# Patient Record
Sex: Male | Born: 1935 | Race: White | Hispanic: No | Marital: Married | Smoking: Never smoker
Health system: Southern US, Community
[De-identification: ages and names within clinical notes are randomized; demographics above are authoritative.]

## PROBLEM LIST (undated history)

## (undated) DIAGNOSIS — E785 Hyperlipidemia, unspecified: Secondary | ICD-10-CM

## (undated) DIAGNOSIS — R197 Diarrhea, unspecified: Secondary | ICD-10-CM

## (undated) DIAGNOSIS — H547 Unspecified visual loss: Secondary | ICD-10-CM

## (undated) DIAGNOSIS — I639 Cerebral infarction, unspecified: Secondary | ICD-10-CM

## (undated) HISTORY — PX: BACK SURGERY: SHX140

## (undated) HISTORY — PX: JOINT REPLACEMENT: SHX530

## (undated) HISTORY — PX: APPENDECTOMY: SHX54

---

## 2021-01-07 ENCOUNTER — Inpatient Hospital Stay (HOSPITAL_COMMUNITY)
Admission: EM | Admit: 2021-01-07 | Discharge: 2021-01-14 | DRG: 565 | Disposition: A | Discharge status: Skilled Nursing Facility | Payer: No Typology Code available for payment source | Attending: General Surgery | Admitting: General Surgery

## 2021-01-07 ENCOUNTER — Emergency Department (HOSPITAL_COMMUNITY): Payer: No Typology Code available for payment source

## 2021-01-07 ENCOUNTER — Other Ambulatory Visit: Payer: Self-pay

## 2021-01-07 ENCOUNTER — Encounter (HOSPITAL_COMMUNITY): Payer: Self-pay | Admitting: General Surgery

## 2021-01-07 DIAGNOSIS — Z880 Allergy status to penicillin: Secondary | ICD-10-CM

## 2021-01-07 DIAGNOSIS — R0902 Hypoxemia: Secondary | ICD-10-CM | POA: Diagnosis not present

## 2021-01-07 DIAGNOSIS — I714 Abdominal aortic aneurysm, without rupture: Secondary | ICD-10-CM | POA: Diagnosis present

## 2021-01-07 DIAGNOSIS — I4819 Other persistent atrial fibrillation: Secondary | ICD-10-CM | POA: Diagnosis present

## 2021-01-07 DIAGNOSIS — I4891 Unspecified atrial fibrillation: Secondary | ICD-10-CM | POA: Diagnosis not present

## 2021-01-07 DIAGNOSIS — I712 Thoracic aortic aneurysm, without rupture: Secondary | ICD-10-CM | POA: Diagnosis present

## 2021-01-07 DIAGNOSIS — Z20822 Contact with and (suspected) exposure to covid-19: Secondary | ICD-10-CM | POA: Diagnosis present

## 2021-01-07 DIAGNOSIS — Z8673 Personal history of transient ischemic attack (TIA), and cerebral infarction without residual deficits: Secondary | ICD-10-CM

## 2021-01-07 DIAGNOSIS — S2232XA Fracture of one rib, left side, initial encounter for closed fracture: Secondary | ICD-10-CM | POA: Diagnosis present

## 2021-01-07 DIAGNOSIS — R339 Retention of urine, unspecified: Secondary | ICD-10-CM | POA: Diagnosis not present

## 2021-01-07 DIAGNOSIS — Z888 Allergy status to other drugs, medicaments and biological substances status: Secondary | ICD-10-CM | POA: Diagnosis not present

## 2021-01-07 DIAGNOSIS — S2222XA Fracture of body of sternum, initial encounter for closed fracture: Principal | ICD-10-CM

## 2021-01-07 DIAGNOSIS — I350 Nonrheumatic aortic (valve) stenosis: Secondary | ICD-10-CM | POA: Diagnosis present

## 2021-01-07 DIAGNOSIS — Z8249 Family history of ischemic heart disease and other diseases of the circulatory system: Secondary | ICD-10-CM | POA: Diagnosis not present

## 2021-01-07 DIAGNOSIS — Y92410 Unspecified street and highway as the place of occurrence of the external cause: Secondary | ICD-10-CM

## 2021-01-07 DIAGNOSIS — S2220XA Unspecified fracture of sternum, initial encounter for closed fracture: Secondary | ICD-10-CM | POA: Diagnosis present

## 2021-01-07 DIAGNOSIS — I959 Hypotension, unspecified: Secondary | ICD-10-CM | POA: Diagnosis not present

## 2021-01-07 DIAGNOSIS — Z23 Encounter for immunization: Secondary | ICD-10-CM | POA: Diagnosis not present

## 2021-01-07 DIAGNOSIS — R410 Disorientation, unspecified: Secondary | ICD-10-CM | POA: Diagnosis not present

## 2021-01-07 DIAGNOSIS — Z66 Do not resuscitate: Secondary | ICD-10-CM | POA: Diagnosis present

## 2021-01-07 DIAGNOSIS — E785 Hyperlipidemia, unspecified: Secondary | ICD-10-CM | POA: Diagnosis present

## 2021-01-07 DIAGNOSIS — Z79899 Other long term (current) drug therapy: Secondary | ICD-10-CM | POA: Diagnosis not present

## 2021-01-07 DIAGNOSIS — N401 Enlarged prostate with lower urinary tract symptoms: Secondary | ICD-10-CM | POA: Diagnosis present

## 2021-01-07 DIAGNOSIS — N138 Other obstructive and reflux uropathy: Secondary | ICD-10-CM | POA: Diagnosis present

## 2021-01-07 DIAGNOSIS — I639 Cerebral infarction, unspecified: Secondary | ICD-10-CM | POA: Insufficient documentation

## 2021-01-07 DIAGNOSIS — S2239XA Fracture of one rib, unspecified side, initial encounter for closed fracture: Secondary | ICD-10-CM | POA: Diagnosis present

## 2021-01-07 DIAGNOSIS — R079 Chest pain, unspecified: Secondary | ICD-10-CM | POA: Diagnosis present

## 2021-01-07 DIAGNOSIS — H3530 Unspecified macular degeneration: Secondary | ICD-10-CM | POA: Diagnosis present

## 2021-01-07 DIAGNOSIS — Z886 Allergy status to analgesic agent status: Secondary | ICD-10-CM | POA: Diagnosis not present

## 2021-01-07 DIAGNOSIS — E876 Hypokalemia: Secondary | ICD-10-CM | POA: Diagnosis present

## 2021-01-07 DIAGNOSIS — Z7901 Long term (current) use of anticoagulants: Secondary | ICD-10-CM

## 2021-01-07 DIAGNOSIS — H547 Unspecified visual loss: Secondary | ICD-10-CM | POA: Insufficient documentation

## 2021-01-07 DIAGNOSIS — Z87898 Personal history of other specified conditions: Secondary | ICD-10-CM

## 2021-01-07 HISTORY — DX: Cerebral infarction, unspecified: I63.9

## 2021-01-07 HISTORY — DX: Hyperlipidemia, unspecified: E78.5

## 2021-01-07 HISTORY — DX: Diarrhea, unspecified: R19.7

## 2021-01-07 HISTORY — DX: Unspecified visual loss: H54.7

## 2021-01-07 LAB — COMPREHENSIVE METABOLIC PANEL
ALT: 26 U/L (ref 0–44)
AST: 49 U/L — ABNORMAL HIGH (ref 15–41)
Albumin: 3.2 g/dL — ABNORMAL LOW (ref 3.5–5.0)
Alkaline Phosphatase: 75 U/L (ref 38–126)
Anion gap: 5 (ref 5–15)
BUN: 19 mg/dL (ref 8–23)
CO2: 28 mmol/L (ref 22–32)
Calcium: 8.5 mg/dL — ABNORMAL LOW (ref 8.9–10.3)
Chloride: 105 mmol/L (ref 98–111)
Creatinine, Ser: 0.78 mg/dL (ref 0.61–1.24)
GFR, Estimated: >60 mL/min (ref >60)
Glucose, Bld: 103 mg/dL — ABNORMAL HIGH (ref 70–99)
Potassium: 4.6 mmol/L (ref 3.5–5.1)
Sodium: 138 mmol/L (ref 135–145)
Total Bilirubin: 1.8 mg/dL — ABNORMAL HIGH (ref 0.3–1.2)
Total Protein: 5.8 g/dL — ABNORMAL LOW (ref 6.5–8.1)

## 2021-01-07 LAB — RESP PANEL BY RT-PCR (FLU A&B, COVID) ARPGX2
Influenza A by PCR: NEGATIVE
Influenza B by PCR: NEGATIVE
SARS Coronavirus 2 by RT PCR: NEGATIVE

## 2021-01-07 LAB — I-STAT CHEM 8, ED
BUN: 27 mg/dL — ABNORMAL HIGH (ref 8–23)
Calcium, Ion: 1.15 mmol/L (ref 1.15–1.40)
Chloride: 105 mmol/L (ref 98–111)
Creatinine, Ser: 0.7 mg/dL (ref 0.61–1.24)
Glucose, Bld: 99 mg/dL (ref 70–99)
HCT: 36 % — ABNORMAL LOW (ref 39.0–52.0)
Hemoglobin: 12.2 g/dL — ABNORMAL LOW (ref 13.0–17.0)
Potassium: 4 mmol/L (ref 3.5–5.1)
Sodium: 141 mmol/L (ref 135–145)
TCO2: 29 mmol/L (ref 22–32)

## 2021-01-07 LAB — SAMPLE TO BLOOD BANK

## 2021-01-07 LAB — CBC
HCT: 37.2 % — ABNORMAL LOW (ref 39.0–52.0)
Hemoglobin: 12.5 g/dL — ABNORMAL LOW (ref 13.0–17.0)
MCH: 33.1 pg (ref 26.0–34.0)
MCHC: 33.6 g/dL (ref 30.0–36.0)
MCV: 98.4 fL (ref 80.0–100.0)
Platelets: 180 10*3/uL (ref 150–400)
RBC: 3.78 MIL/uL — ABNORMAL LOW (ref 4.22–5.81)
RDW: 13.3 % (ref 11.5–15.5)
WBC: 11.3 10*3/uL — ABNORMAL HIGH (ref 4.0–10.5)
nRBC: 0 % (ref 0.0–0.2)

## 2021-01-07 LAB — LACTIC ACID, PLASMA: Lactic Acid, Venous: 1.7 mmol/L (ref 0.5–1.9)

## 2021-01-07 LAB — ETHANOL: Alcohol, Ethyl (B): <10 mg/dL (ref <10)

## 2021-01-07 LAB — TROPONIN I (HIGH SENSITIVITY)
Troponin I (High Sensitivity): 17 ng/L (ref <18)
Troponin I (High Sensitivity): 217 ng/L (ref <18)

## 2021-01-07 LAB — PROTIME-INR
INR: 1.2 (ref 0.8–1.2)
Prothrombin Time: 15.6 seconds — ABNORMAL HIGH (ref 11.4–15.2)

## 2021-01-07 MED ORDER — TETANUS-DIPHTH-ACELL PERTUSSIS 5-2.5-18.5 LF-MCG/0.5 IM SUSY
0.5000 mL | PREFILLED_SYRINGE | Freq: Once | INTRAMUSCULAR | Status: AC
  Administered 2021-01-07: 0.5 mL via INTRAMUSCULAR
  Filled 2021-01-07: qty 0.5

## 2021-01-07 MED ORDER — FENTANYL CITRATE PF 50 MCG/ML IJ SOSY
25.0000 ug | PREFILLED_SYRINGE | Freq: Once | INTRAMUSCULAR | Status: AC
  Administered 2021-01-07: 25 ug via INTRAVENOUS
  Filled 2021-01-07: qty 1

## 2021-01-07 MED ORDER — ENOXAPARIN SODIUM 30 MG/0.3ML IJ SOSY
30.0000 mg | PREFILLED_SYRINGE | Freq: Two times a day (BID) | INTRAMUSCULAR | Status: DC
  Administered 2021-01-08: 30 mg via SUBCUTANEOUS
  Filled 2021-01-07: qty 0.3

## 2021-01-07 MED ORDER — IOHEXOL 350 MG/ML SOLN
70.0000 mL | Freq: Once | INTRAVENOUS | Status: AC | PRN
  Administered 2021-01-07: 70 mL via INTRAVENOUS

## 2021-01-07 MED ORDER — METHOCARBAMOL 500 MG PO TABS
500.0000 mg | ORAL_TABLET | Freq: Three times a day (TID) | ORAL | Status: DC | PRN
  Administered 2021-01-09 – 2021-01-12 (×4): 500 mg via ORAL
  Filled 2021-01-07 (×4): qty 1

## 2021-01-07 MED ORDER — MORPHINE SULFATE (PF) 4 MG/ML IV SOLN
4.0000 mg | INTRAVENOUS | Status: DC | PRN
Start: 2021-01-07 — End: 2021-01-08
  Administered 2021-01-08: 4 mg via INTRAVENOUS
  Filled 2021-01-07: qty 1

## 2021-01-07 MED ORDER — ONDANSETRON 4 MG PO TBDP: 4.0000 mg | ORAL_TABLET | Freq: Four times a day (QID) | ORAL | Status: DC | PRN

## 2021-01-07 MED ORDER — SODIUM CHLORIDE 0.9% FLUSH: 3.0000 mL | INTRAVENOUS | Status: DC | PRN

## 2021-01-07 MED ORDER — METOPROLOL TARTRATE 5 MG/5ML IV SOLN: 5.0000 mg | Freq: Four times a day (QID) | INTRAVENOUS | Status: DC | PRN

## 2021-01-07 MED ORDER — TRAMADOL HCL 50 MG PO TABS
50.0000 mg | ORAL_TABLET | Freq: Four times a day (QID) | ORAL | Status: DC | PRN
Start: 2021-01-07 — End: 2021-01-15
  Administered 2021-01-09 – 2021-01-12 (×3): 50 mg via ORAL
  Filled 2021-01-07 (×4): qty 1

## 2021-01-07 MED ORDER — SODIUM CHLORIDE 0.9% FLUSH
3.0000 mL | Freq: Two times a day (BID) | INTRAVENOUS | Status: DC
  Administered 2021-01-07 – 2021-01-12 (×12): 3 mL via INTRAVENOUS

## 2021-01-07 MED ORDER — ACETAMINOPHEN 325 MG PO TABS: 650.0000 mg | ORAL_TABLET | ORAL | Status: DC | PRN

## 2021-01-07 MED ORDER — SODIUM CHLORIDE 0.9 % IV SOLN: 250.0000 mL | INTRAVENOUS | Status: DC | PRN

## 2021-01-07 MED ORDER — ONDANSETRON HCL 4 MG/2ML IJ SOLN: 4.0000 mg | Freq: Four times a day (QID) | INTRAMUSCULAR | Status: DC | PRN

## 2021-01-07 NOTE — ED Notes (Signed)
Patient transported to CT 
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
## 2021-01-07 NOTE — Progress Notes (Signed)
Patient arrived to unit via ED. Pt Aox3, on 3L [place] and on cardiac monitoring. Patient at time not c/o any pain.  Pt belongings in white hospital bag with clothes and shoes in it.   Pt was oriented to room and unit. Bed placed in lowest position nonslip socks applied and bed alarm on. Pt family at bedside was oriented to unit visitation hours.

## 2021-01-07 NOTE — Progress Notes (Addendum)
2301-RN was called from lab for critical troponin value of 217   2330-MD Kinsinger Paged   Awaiting for any new orders   2345- No new orders given

## 2021-01-07 NOTE — ED Notes (Signed)
C-collar applied per Dr Rush Landmark verbal order
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
## 2021-01-07 NOTE — ED Notes (Signed)
Attempted report 
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
## 2021-01-07 NOTE — ED Notes (Signed)
Skin tear on right elbow cleaned and dressed.

## 2021-01-08 ENCOUNTER — Inpatient Hospital Stay (HOSPITAL_COMMUNITY): Payer: No Typology Code available for payment source

## 2021-01-08 DIAGNOSIS — I4891 Unspecified atrial fibrillation: Secondary | ICD-10-CM | POA: Diagnosis not present

## 2021-01-08 LAB — ECHOCARDIOGRAM COMPLETE
AR max vel: 1.4 cm2
AV Area VTI: 1.32 cm2
AV Area mean vel: 1.43 cm2
AV Mean grad: 9 mmHg
AV Peak grad: 14.6 mmHg
Ao pk vel: 1.91 m/s
S' Lateral: 3.5 cm

## 2021-01-08 LAB — BASIC METABOLIC PANEL
Anion gap: 7 (ref 5–15)
BUN: 15 mg/dL (ref 8–23)
CO2: 26 mmol/L (ref 22–32)
Calcium: 8.6 mg/dL — ABNORMAL LOW (ref 8.9–10.3)
Chloride: 104 mmol/L (ref 98–111)
Creatinine, Ser: 0.72 mg/dL (ref 0.61–1.24)
GFR, Estimated: >60 mL/min (ref >60)
Glucose, Bld: 115 mg/dL — ABNORMAL HIGH (ref 70–99)
Potassium: 3.2 mmol/L — ABNORMAL LOW (ref 3.5–5.1)
Sodium: 137 mmol/L (ref 135–145)

## 2021-01-08 LAB — CBC
HCT: 37.4 % — ABNORMAL LOW (ref 39.0–52.0)
Hemoglobin: 12.8 g/dL — ABNORMAL LOW (ref 13.0–17.0)
MCH: 33.6 pg (ref 26.0–34.0)
MCHC: 34.2 g/dL (ref 30.0–36.0)
MCV: 98.2 fL (ref 80.0–100.0)
Platelets: 167 10*3/uL (ref 150–400)
RBC: 3.81 MIL/uL — ABNORMAL LOW (ref 4.22–5.81)
RDW: 13.2 % (ref 11.5–15.5)
WBC: 8.4 10*3/uL (ref 4.0–10.5)
nRBC: 0 % (ref 0.0–0.2)

## 2021-01-08 MED ORDER — POTASSIUM CHLORIDE CRYS ER 20 MEQ PO TBCR
40.0000 meq | EXTENDED_RELEASE_TABLET | Freq: Once | ORAL | Status: AC
  Administered 2021-01-08: 40 meq via ORAL
  Filled 2021-01-08: qty 2

## 2021-01-08 MED ORDER — MORPHINE SULFATE (PF) 2 MG/ML IV SOLN
1.0000 mg | INTRAVENOUS | Status: DC | PRN
  Administered 2021-01-08 – 2021-01-12 (×6): 2 mg via INTRAVENOUS
  Filled 2021-01-08 (×6): qty 1

## 2021-01-08 MED ORDER — BETHANECHOL CHLORIDE 25 MG PO TABS
25.0000 mg | ORAL_TABLET | Freq: Three times a day (TID) | ORAL | Status: DC
  Administered 2021-01-08 – 2021-01-14 (×20): 25 mg via ORAL
  Filled 2021-01-08 (×19): qty 1

## 2021-01-08 MED ORDER — APIXABAN 5 MG PO TABS
5.0000 mg | ORAL_TABLET | Freq: Two times a day (BID) | ORAL | Status: DC
  Administered 2021-01-08 – 2021-01-14 (×13): 5 mg via ORAL
  Filled 2021-01-08 (×13): qty 1

## 2021-01-08 MED ORDER — DIPHENHYDRAMINE HCL 50 MG/ML IJ SOLN
12.5000 mg | Freq: Once | INTRAMUSCULAR | Status: AC
  Administered 2021-01-08: 12.5 mg via INTRAVENOUS

## 2021-01-08 MED ORDER — ACETAMINOPHEN 500 MG PO TABS
1000.0000 mg | ORAL_TABLET | Freq: Four times a day (QID) | ORAL | Status: DC
  Administered 2021-01-08 – 2021-01-14 (×16): 1000 mg via ORAL
  Filled 2021-01-08 (×17): qty 2

## 2021-01-08 MED ORDER — DIPHENHYDRAMINE HCL 50 MG/ML IJ SOLN
INTRAMUSCULAR | Status: AC
  Administered 2021-01-08: 12.5 mg
  Filled 2021-01-08: qty 1

## 2021-01-08 MED ORDER — CHLORHEXIDINE GLUCONATE CLOTH 2 % EX PADS
6.0000 | MEDICATED_PAD | Freq: Every day | CUTANEOUS | Status: DC
  Administered 2021-01-08 – 2021-01-14 (×7): 6 via TOPICAL

## 2021-01-08 MED ORDER — TAMSULOSIN HCL 0.4 MG PO CAPS
0.4000 mg | ORAL_CAPSULE | Freq: Every day | ORAL | Status: DC
  Administered 2021-01-08 – 2021-01-14 (×7): 0.4 mg via ORAL
  Filled 2021-01-08 (×7): qty 1
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
## 2021-01-08 NOTE — Progress Notes (Signed)
  Echocardiogram 2D Echocardiogram has been performed.  Delcie Roch 01/08/2021, 5:05 PM
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
## 2021-01-08 NOTE — TOC CAGE-AID Note (Signed)
Transition of Care [facility]) - CAGE-AID Screening   Patient Details  Name: William Velez MRN: [id number] Date of Birth: 10/16/1925  Transition of Care [facility]) CM/SW Contact:    Janora Norlander, RN Phone Number: [phone] 01/08/2021, 11:54 AM   Clinical Narrative: Pt denies alcohol or drug use.   CAGE-AID Screening:    Have You Ever Felt You Ought to Cut Down on Your Drinking or Drug Use?: No Have People Annoyed You By Critizing Your Drinking Or Drug Use?: No Have You Felt Bad Or Guilty About Your Drinking Or Drug Use?: No Have You Ever Had a Drink or Used Drugs First Thing In The Morning to Steady Your Nerves or to Get Rid of a Hangover?: No CAGE-AID Score: 0  Substance Abuse Education Offered: No
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
## 2021-01-09 LAB — CBC
HCT: 36.3 % — ABNORMAL LOW (ref 39.0–52.0)
Hemoglobin: 12.6 g/dL — ABNORMAL LOW (ref 13.0–17.0)
MCH: 33.4 pg (ref 26.0–34.0)
MCHC: 34.7 g/dL (ref 30.0–36.0)
MCV: 96.3 fL (ref 80.0–100.0)
Platelets: 154 10*3/uL (ref 150–400)
RBC: 3.77 MIL/uL — ABNORMAL LOW (ref 4.22–5.81)
RDW: 13.1 % (ref 11.5–15.5)
WBC: 10.2 10*3/uL (ref 4.0–10.5)
nRBC: 0 % (ref 0.0–0.2)

## 2021-01-09 LAB — BASIC METABOLIC PANEL
Anion gap: 13 (ref 5–15)
BUN: 14 mg/dL (ref 8–23)
CO2: 24 mmol/L (ref 22–32)
Calcium: 8.8 mg/dL — ABNORMAL LOW (ref 8.9–10.3)
Chloride: 101 mmol/L (ref 98–111)
Creatinine, Ser: 0.76 mg/dL (ref 0.61–1.24)
GFR, Estimated: >60 mL/min (ref >60)
Glucose, Bld: 123 mg/dL — ABNORMAL HIGH (ref 70–99)
Potassium: 3.5 mmol/L (ref 3.5–5.1)
Sodium: 138 mmol/L (ref 135–145)

## 2021-01-09 MED ORDER — METOPROLOL TARTRATE 12.5 MG HALF TABLET
12.5000 mg | ORAL_TABLET | Freq: Two times a day (BID) | ORAL | Status: DC
  Administered 2021-01-09: 12.5 mg via ORAL
  Filled 2021-01-09 (×3): qty 1

## 2021-01-09 MED ORDER — DEXTROSE-NACL 5-0.45 % IV SOLN: INTRAVENOUS | Status: DC
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
## 2021-01-09 NOTE — Progress Notes (Signed)
Pt BP 87/70 left arm, recheck was 81/63(70) right arm. Pt is not distressed this time, no c/o of dizziness or pain. Will hold metoprolol dose.  Have paged provider of pt status.

## 2021-01-10 MED ORDER — DIPHENHYDRAMINE HCL 50 MG/ML IJ SOLN
12.5000 mg | Freq: Four times a day (QID) | INTRAMUSCULAR | Status: DC | PRN
  Administered 2021-01-10 – 2021-01-14 (×3): 12.5 mg via INTRAVENOUS
  Filled 2021-01-10 (×3): qty 1

## 2021-01-10 MED ORDER — METOPROLOL TARTRATE 12.5 MG HALF TABLET
12.5000 mg | ORAL_TABLET | Freq: Two times a day (BID) | ORAL | Status: DC
  Administered 2021-01-10 – 2021-01-14 (×7): 12.5 mg via ORAL
  Filled 2021-01-10 (×8): qty 1

## 2021-01-10 NOTE — Progress Notes (Signed)
Patient agitated/irritable, pulled IV out, removed tele leads. Pt kicking and hitting on staff members. Unable to control. Not able to follow simple commands. MD notified.

## 2021-01-10 NOTE — Progress Notes (Signed)
Per tele, pt had 20 beat run of SVT. HR 90-100's, afib, BP 111/78.  Paged provider.
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
## 2021-01-10 NOTE — Progress Notes (Signed)
SLP Cancellation Note  Patient Details Name: William Velez MRN: [id number] DOB: [birth year]   Cancelled treatment:       Reason Eval/Treat Not Completed: Other (comment) (Radiology was contacted at 1400 as requested and staff advised that the MBS could not be completed today. SLP will follow up on 9/12.)  Agam Davenport I. Vear Clock, MS, CCC-SLP Acute Rehabilitation Services Office number [phone] Pager [phone]  Scheryl Marten 01/10/2021, 2:24 PM

## 2021-01-10 NOTE — Progress Notes (Signed)
Pt was emotional with family members today, saying he "didn't want to die". Pt more alert today and c/o of pain in his shoulders and sternal area. Having trouble coughing deeply d/t sternal pain. Pt given prn meds and family at bedside during quiet time for comfort and support.

## 2021-01-11 ENCOUNTER — Inpatient Hospital Stay (HOSPITAL_COMMUNITY): Payer: No Typology Code available for payment source

## 2021-01-11 DIAGNOSIS — I4891 Unspecified atrial fibrillation: Secondary | ICD-10-CM

## 2021-01-11 LAB — URINALYSIS, ROUTINE W REFLEX MICROSCOPIC
Bilirubin Urine: NEGATIVE
Glucose, UA: NEGATIVE mg/dL
Ketones, ur: NEGATIVE mg/dL
Nitrite: NEGATIVE
Protein, ur: NEGATIVE mg/dL
RBC / HPF: >50 RBC/hpf — ABNORMAL HIGH (ref 0–5)
Specific Gravity, Urine: 1.012 (ref 1.005–1.030)
pH: 6 (ref 5.0–8.0)

## 2021-01-11 LAB — CBC
HCT: 37.8 % — ABNORMAL LOW (ref 39.0–52.0)
Hemoglobin: 12.6 g/dL — ABNORMAL LOW (ref 13.0–17.0)
MCH: 32.7 pg (ref 26.0–34.0)
MCHC: 33.3 g/dL (ref 30.0–36.0)
MCV: 98.2 fL (ref 80.0–100.0)
Platelets: 184 10*3/uL (ref 150–400)
RBC: 3.85 MIL/uL — ABNORMAL LOW (ref 4.22–5.81)
RDW: 13.3 % (ref 11.5–15.5)
WBC: 8.4 10*3/uL (ref 4.0–10.5)
nRBC: 0 % (ref 0.0–0.2)

## 2021-01-11 LAB — BASIC METABOLIC PANEL
Anion gap: 5 (ref 5–15)
BUN: 13 mg/dL (ref 8–23)
CO2: 27 mmol/L (ref 22–32)
Calcium: 8.6 mg/dL — ABNORMAL LOW (ref 8.9–10.3)
Chloride: 106 mmol/L (ref 98–111)
Creatinine, Ser: 0.58 mg/dL — ABNORMAL LOW (ref 0.61–1.24)
GFR, Estimated: >60 mL/min (ref >60)
Glucose, Bld: 110 mg/dL — ABNORMAL HIGH (ref 70–99)
Potassium: 4 mmol/L (ref 3.5–5.1)
Sodium: 138 mmol/L (ref 135–145)

## 2021-01-11 LAB — SARS CORONAVIRUS 2 (TAT 6-24 HRS): SARS Coronavirus 2: NEGATIVE

## 2021-01-11 NOTE — Progress Notes (Signed)
Modified Barium Swallow Progress Note  Patient Details  Name: William Velez MRN: [id number] Date of Birth: 02/07/1926  Today's Date: 01/11/2021  Modified Barium Swallow completed.  Full report located under Chart Review in the Imaging Section.  Brief recommendations include the following:  Clinical Impression  (P) Pt was seen for an MBS d/t concerns during BSE of possible penetration/aspiration across all consistencies. Pt was awake and alert and presents with severe visual impairment at baseline. SLP trialed thin and nectar thick liquids, puree, and regular solids. Pt oral phase intact for thin, nectar, and puree, but c/b prolonged mastication and prolonged oral transit for regular solids. Overall pharyngeal phase impairments include mod/max residue in vallecula, min residue in pyriform sinuses and lateral channels, penetration, and silent aspiration. Silent aspiration occured with large/consecutive sips of thin and nectar thick liquids only. Teaspoon/small cup sips of thin liquid resulted in penetration from spillage from vallecula with no attempt to clear. SLP educated pt and cued for chin-tuck technique during thin liquid trials with same result. Puree and regular solids also resulted in penetration above the vocal folds, partially cleared by a second swallow. Residue in vallecula, pyriforms, and lateral channels present across all trials, with lateral channel residue more prominent with nectar thick and puree. Recommend Dys3/nectar-thick liquids using small sips/bites technique to decrease likelihood of silent aspiration and increase ease of mastication and bolus transit during oral phase.   Swallow Evaluation Recommendations       SLP Diet Recommendations: (P) Dysphagia 3 (Mech soft) solids;Nectar thick liquid   Liquid Administration via: (P) Cup;Straw   Medication Administration: (P) Crushed with puree   Supervision: (P) Full supervision/cueing for compensatory strategies    Compensations: (P) Minimize environmental distractions;Small sips/bites;Slow rate   Postural Changes: (P) Seated upright at 90 degrees   Oral Care Recommendations: (P) Oral care BID   Other Recommendations: (P) Order thickener from pharmacy    Royce Macadamia 01/11/2021,1:40 PM

## 2021-01-11 NOTE — Progress Notes (Signed)
MD, Dr. Orson Ape a non-violence soft wrist and ankle restraints. Benadryl 12.5 ml IV push PRN also ordered.  Restraints applied, benadryl administered. Will continue monitoring.
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
## 2021-01-11 NOTE — TOC Progression Note (Addendum)
Transition of Care [facility]) - Progression Note    Patient Details  Name: William Velez MRN: [id number] Date of Birth: 10/16/1925  Transition of Care [facility]) CM/SW Contact  Glennon Mac, RN Phone Number: 01/11/2021, 4:12 PM  Clinical Narrative:   SNF bed offers reviewed with patient's son, Onalee Hua.  Son Government social research officer at [employer] for SNF.  Notified Grayce Sessions in admissions at facility; she states she will reach out to the [place] for authorization.  Notified PA of need for COVID testing prior to discharge to SNF.  Hopeful for discharge to SNF tomorrow pending medical stability and VA authorization.  Expected Discharge Plan: Skilled Nursing Facility Barriers to Discharge: Continued Medical Work up  Expected Discharge Plan and Services Expected Discharge Plan: Skilled Nursing Facility   Discharge Planning Services: CM Consult Post Acute Care Choice: Skilled Nursing Facility Living arrangements for the past 2 months: Single Family Home                                       Social Determinants of Health (SDOH) Interventions    Readmission Risk Interventions No flowsheet data found.  Quintella Baton, RN, BSN  Trauma/Neuro ICU Case Manager [phone]
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
## 2021-01-11 NOTE — Discharge Instructions (Addendum)

## 2021-01-12 DIAGNOSIS — S2232XA Fracture of one rib, left side, initial encounter for closed fracture: Secondary | ICD-10-CM | POA: Diagnosis present

## 2021-01-12 DIAGNOSIS — R0902 Hypoxemia: Secondary | ICD-10-CM | POA: Diagnosis not present

## 2021-01-12 DIAGNOSIS — Z87898 Personal history of other specified conditions: Secondary | ICD-10-CM

## 2021-01-12 DIAGNOSIS — N401 Enlarged prostate with lower urinary tract symptoms: Secondary | ICD-10-CM | POA: Diagnosis present

## 2021-01-12 MED ORDER — SULFAMETHOXAZOLE-TRIMETHOPRIM 800-160 MG PO TABS
1.0000 | ORAL_TABLET | Freq: Once | ORAL | Status: AC
  Administered 2021-01-12: 1 via ORAL
  Filled 2021-01-12 (×2): qty 1

## 2021-01-12 MED ORDER — BACITRACIN ZINC 500 UNIT/GM EX OINT
TOPICAL_OINTMENT | Freq: Two times a day (BID) | CUTANEOUS | Status: DC
  Filled 2021-01-12: qty 28.4
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
## 2021-01-12 NOTE — Progress Notes (Signed)
Called and updated son Ramces Shomaker, on patient progress and plans and that we are still waiting on insurance approval.

## 2021-01-12 NOTE — TOC Progression Note (Addendum)
Transition of Care [facility]) - Progression Note    Patient Details  Name: William Velez MRN: [id number] Date of Birth: 10/16/1925  [facility]) CM/SW Contact  Glennon Mac, RN Phone Number: 01/12/2021, 12:14 PM  Clinical Narrative: Left message with Grayce Sessions requesting update on insurance authorization. Will provide updates as they are available.     Addendum: 1:48pm Spoke with Peggy in admissions at [place]; still waiting on approval from [place].      Expected Discharge Plan: Skilled Nursing Facility Barriers to Discharge: Continued Medical Work up  Expected Discharge Plan and Services Expected Discharge Plan: Skilled Nursing Facility   Discharge Planning Services: CM Consult Post Acute Care Choice: Skilled Nursing Facility Living arrangements for the past 2 months: Single Family Home                                       Social Determinants of Health (SDOH) Interventions    Readmission Risk Interventions No flowsheet data found.   Quintella Baton, RN, BSN  Trauma/Neuro ICU Case Manager [phone]

## 2021-01-12 NOTE — Progress Notes (Signed)
Notified by nursing patient only voided once today and bladder scan 386 mL. Patient is oriented to self and place but is a little confused. He does complain of bladder pain to palpation. Called and updated son who gave consent for foley placement. Foley placed. Will reach out to urology to see if any further reccs. Bacitracin to urethral meatus given recent caths and one dose prophylactic bactrim today. BMP tomorrow am

## 2021-01-13 LAB — BASIC METABOLIC PANEL
Anion gap: 6 (ref 5–15)
BUN: 13 mg/dL (ref 8–23)
CO2: 26 mmol/L (ref 22–32)
Calcium: 8.3 mg/dL — ABNORMAL LOW (ref 8.9–10.3)
Chloride: 103 mmol/L (ref 98–111)
Creatinine, Ser: 0.65 mg/dL (ref 0.61–1.24)
GFR, Estimated: >60 mL/min (ref >60)
Glucose, Bld: 143 mg/dL — ABNORMAL HIGH (ref 70–99)
Potassium: 3.7 mmol/L (ref 3.5–5.1)
Sodium: 135 mmol/L (ref 135–145)
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
## 2021-01-13 NOTE — TOC Progression Note (Addendum)
Transition of Care [facility]) - Progression Note    Patient Details  Name: William Velez MRN: [id number] Date of Birth: 05-10-1925  Transition of Care [facility]) CM/SW Contact  Astrid Drafts Berna Spare, RN Phone Number: 01/13/2021, 12:12 PM  Clinical Narrative:   Left message for Grayce Sessions requesting update on insurance authorization for SNF. Will follow with updates as available.   Addendum: [phone] Notified by Precious Gilding with the [facility] that patient does not have SNF benefit due to being non-service connected.  Will initiate insurance auth through patient's Medicare benefit through [facility] Portal.   Addendum: [phone] Insurance authorization for SNF initiated through [facility] Portal; reference [id number].    Addendum: [phone] I have received insurance authorization through [employer] for skilled nursing facility, auth ID [id number].  Next review on 01/15/21.  Notified Grayce Sessions at facility; she states that she cannot accept patient today, but he will be able to come tomorrow, 9/15.  Notified provider of need for discharge summary and discharge order in a.m.  Will notify patient's son, Onalee Hua.    Expected Discharge Plan: Skilled Nursing Facility Barriers to Discharge: Continued Medical Work up  Expected Discharge Plan and Services Expected Discharge Plan: Skilled Nursing Facility   Discharge Planning Services: CM Consult Post Acute Care Choice: Skilled Nursing Facility Living arrangements for the past 2 months: Single Family Home                                       Social Determinants of Health (SDOH) Interventions    Readmission Risk Interventions No flowsheet data found.  Quintella Baton, RN, BSN  Trauma/Neuro ICU Case Manager [phone]
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
## 2021-01-14 MED ORDER — METOPROLOL TARTRATE 25 MG PO TABS: 12.5000 mg | ORAL_TABLET | Freq: Two times a day (BID) | ORAL | 0 refills | Status: AC

## 2021-01-14 MED ORDER — ACETAMINOPHEN 500 MG PO TABS: 1000.0000 mg | ORAL_TABLET | Freq: Four times a day (QID) | ORAL | 0 refills | Status: AC

## 2021-01-14 MED ORDER — TAMSULOSIN HCL 0.4 MG PO CAPS: 0.4000 mg | ORAL_CAPSULE | Freq: Every day | ORAL | 0 refills | Status: AC

## 2021-01-14 MED ORDER — BACITRACIN ZINC 500 UNIT/GM EX OINT: TOPICAL_OINTMENT | Freq: Two times a day (BID) | CUTANEOUS | 0 refills | Status: AC

## 2021-01-14 MED ORDER — BETHANECHOL CHLORIDE 25 MG PO TABS: 25.0000 mg | ORAL_TABLET | Freq: Three times a day (TID) | ORAL | 1 refills | Status: AC

## 2021-01-14 MED ORDER — TRAMADOL HCL 50 MG PO TABS: 50.0000 mg | ORAL_TABLET | Freq: Four times a day (QID) | ORAL | 0 refills | Status: AC | PRN

## 2021-01-14 NOTE — TOC Transition Note (Signed)
Transition of Care [facility]) - CM/SW Discharge Note   Patient Details  Name: William Velez MRN: [id number] Date of Birth: [birth year]  Transition of Care [facility]) CM/SW Contact:  Glennon Mac, RN Phone Number: 01/14/2021, 11:42 AM   Clinical Narrative:    Pt medically stable for discharge to SNF today, and insurance auth received.  DC summary sent to Peggy at [facility].  [facility] notified for transport at 1142AM. Notified pt's son of transfer.  Patient going to room 115; bedside nurse to call report to facility at [phone].    Final next level of care: Skilled Nursing Facility Barriers to Discharge: Barriers Resolved   Patient Goals and CMS Choice Patient states their goals for this hospitalization and ongoing recovery are:: to go home CMS Medicare.gov Compare Post Acute Care list provided to:: Patient Represenative (must comment) (son) Choice offered to / list presented to : [facility] POA / Guardian, Adult Children  Discharge Placement PASRR number recieved: 01/14/21            Patient chooses bed at: [facility] Patient to be transferred to facility by: [facility] Name of family member notified: Bruna Potter Patient and family notified of of transfer: 01/14/21  Discharge Plan and Services   Discharge Planning Services: CM Consult Post Acute Care Choice: Skilled Nursing Facility                               Social Determinants of Health (SDOH) Interventions     Readmission Risk Interventions No flowsheet data found.  Quintella Baton, RN, BSN  Trauma/Neuro ICU Case Manager [phone]

## 2021-06-30 DEATH — deceased
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
